# Patient Record
Sex: Male | Born: 1985 | Race: White | Hispanic: No | Marital: Single | Smoking: Current every day smoker
Health system: Southern US, Community
[De-identification: ages and names within clinical notes are randomized; demographics above are authoritative.]

## PROBLEM LIST (undated history)

## (undated) ENCOUNTER — Ambulatory Visit: Payer: BC Managed Care – PPO

## (undated) DIAGNOSIS — N200 Calculus of kidney: Secondary | ICD-10-CM

## (undated) HISTORY — PX: NO PAST SURGERIES: SHX2092

---
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
## 2006-12-04 ENCOUNTER — Ambulatory Visit: Payer: Self-pay | Admitting: Family Medicine

## 2008-09-28 ENCOUNTER — Emergency Department: Payer: Self-pay | Admitting: Emergency Medicine

## 2011-01-31 ENCOUNTER — Emergency Department: Payer: Self-pay | Admitting: Emergency Medicine

## 2011-05-21 ENCOUNTER — Emergency Department: Payer: Self-pay | Admitting: Emergency Medicine

## 2015-08-15 ENCOUNTER — Encounter: Payer: Self-pay | Admitting: Emergency Medicine

## 2015-08-15 ENCOUNTER — Emergency Department
Admission: EM | Admit: 2015-08-15 | Discharge: 2015-08-15 | Disposition: A | Discharge status: Home / Self Care | Payer: BLUE CROSS/BLUE SHIELD | Attending: Emergency Medicine | Admitting: Emergency Medicine

## 2015-08-15 ENCOUNTER — Emergency Department: Payer: BLUE CROSS/BLUE SHIELD

## 2015-08-15 DIAGNOSIS — R109 Unspecified abdominal pain: Secondary | ICD-10-CM | POA: Diagnosis present

## 2015-08-15 DIAGNOSIS — F17200 Nicotine dependence, unspecified, uncomplicated: Secondary | ICD-10-CM | POA: Insufficient documentation

## 2015-08-15 DIAGNOSIS — N23 Unspecified renal colic: Secondary | ICD-10-CM | POA: Insufficient documentation

## 2015-08-15 LAB — CBC
HCT: 39.6 % — ABNORMAL LOW (ref 40.0–52.0)
HEMOGLOBIN: 13.4 g/dL (ref 13.0–18.0)
MCH: 31.2 pg (ref 26.0–34.0)
MCHC: 33.9 g/dL (ref 32.0–36.0)
MCV: 92.1 fL (ref 80.0–100.0)
Platelets: 315 10*3/uL (ref 150–440)
RBC: 4.3 MIL/uL — AB (ref 4.40–5.90)
RDW: 12.7 % (ref 11.5–14.5)
WBC: 13.3 10*3/uL — AB (ref 3.8–10.6)

## 2015-08-15 LAB — URINALYSIS COMPLETE WITH MICROSCOPIC (ARMC ONLY)
BILIRUBIN URINE: NEGATIVE
GLUCOSE, UA: NEGATIVE mg/dL
Ketones, ur: NEGATIVE mg/dL
Leukocytes, UA: NEGATIVE
Nitrite: NEGATIVE
PH: 5 (ref 5.0–8.0)
Protein, ur: 30 mg/dL — AB
Specific Gravity, Urine: 1.028 (ref 1.005–1.030)

## 2015-08-15 LAB — BASIC METABOLIC PANEL
ANION GAP: 5 (ref 5–15)
BUN: 12 mg/dL (ref 6–20)
CHLORIDE: 108 mmol/L (ref 101–111)
CO2: 27 mmol/L (ref 22–32)
Calcium: 8.8 mg/dL — ABNORMAL LOW (ref 8.9–10.3)
Creatinine, Ser: 0.87 mg/dL (ref 0.61–1.24)
GFR calc Af Amer: >60 mL/min (ref >60)
Glucose, Bld: 127 mg/dL — ABNORMAL HIGH (ref 65–99)
POTASSIUM: 3.7 mmol/L (ref 3.5–5.1)
SODIUM: 140 mmol/L (ref 135–145)

## 2015-08-15 MED ORDER — KETOROLAC TROMETHAMINE 30 MG/ML IJ SOLN
30.0000 mg | Freq: Once | INTRAMUSCULAR | Status: AC
  Administered 2015-08-15: 30 mg via INTRAVENOUS
  Filled 2015-08-15: qty 1

## 2015-08-15 MED ORDER — TAMSULOSIN HCL 0.4 MG PO CAPS
0.4000 mg | ORAL_CAPSULE | Freq: Every day | ORAL | Status: DC
Start: 2015-08-15 — End: 2017-10-09

## 2015-08-15 MED ORDER — ONDANSETRON 4 MG PO TBDP: 4.0000 mg | ORAL_TABLET | Freq: Three times a day (TID) | ORAL | Status: DC | PRN

## 2015-08-15 MED ORDER — KETOROLAC TROMETHAMINE 10 MG PO TABS: 10.0000 mg | ORAL_TABLET | Freq: Three times a day (TID) | ORAL | Status: DC | PRN

## 2015-08-15 MED ORDER — HYDROMORPHONE HCL 1 MG/ML IJ SOLN
1.0000 mg | Freq: Once | INTRAMUSCULAR | Status: AC
  Administered 2015-08-15: 1 mg via INTRAVENOUS
  Filled 2015-08-15: qty 1

## 2015-08-15 MED ORDER — ONDANSETRON HCL 4 MG/2ML IJ SOLN
4.0000 mg | Freq: Once | INTRAMUSCULAR | Status: AC
  Administered 2015-08-15: 4 mg via INTRAVENOUS
  Filled 2015-08-15: qty 2

## 2015-08-15 MED ORDER — OXYCODONE-ACETAMINOPHEN 5-325 MG PO TABS: 1.0000 | ORAL_TABLET | Freq: Four times a day (QID) | ORAL | Status: AC | PRN

## 2015-08-15 MED ORDER — SODIUM CHLORIDE 0.9 % IV BOLUS (SEPSIS)
1000.0000 mL | Freq: Once | INTRAVENOUS | Status: AC
  Administered 2015-08-15: 1000 mL via INTRAVENOUS

## 2015-08-15 NOTE — ED Provider Notes (Signed)
[facility] Emergency Department Provider Note  ____________________________________________  Time seen: Approximately 9:04 PM  I have reviewed the triage vital signs and the nursing notes.   HISTORY  Chief Complaint Flank Pain    HPI Michael BjorkDavid A Dorris Jr. is a 30 y.o. male with a family history of renal colic presenting with acute onset of right flank pain.Patient states he woke up with pain in the right flank which has progressively gotten worse and more severe. The pain radiates into the penis. Patient also reports a sensation of being unable to completely void. No hematuria. No fever. Positive nausea without vomiting. He is unable to get comfortable and there is nothing that makes the pain better.   History reviewed. No pertinent past medical history.  There are no active problems to display for this patient.   History reviewed. No pertinent past surgical history.  Current Outpatient Rx  Name  Route  Sig  Dispense  Refill  . ketorolac (TORADOL) 10 MG tablet   Oral   Take 1 tablet (10 mg total) by mouth every 8 (eight) hours as needed for moderate pain (with food).   15 tablet   0   . ondansetron (ZOFRAN ODT) 4 MG disintegrating tablet   Oral   Take 1 tablet (4 mg total) by mouth every 8 (eight) hours as needed for nausea or vomiting.   20 tablet   0   . oxyCODONE-acetaminophen (ROXICET) 5-325 MG tablet   Oral   Take 1 tablet by mouth every 6 (six) hours as needed.   20 tablet   0   . tamsulosin (FLOMAX) 0.4 MG CAPS capsule   Oral   Take 1 capsule (0.4 mg total) by mouth daily.   15 capsule   0     Allergies Review of patient's allergies indicates no known allergies.  History reviewed. No pertinent family history.  Social History Social History  Substance Use Topics  . Smoking status: Current Every Day Smoker  . Smokeless tobacco: None  . Alcohol Use: No    Review of Systems Constitutional: No fever/chills. No  lightheadedness or syncope. Eyes: No visual changes. ENT: No sore throat. Cardiovascular: Denies chest pain, palpitations. Respiratory: Denies shortness of breath.  No cough. Gastrointestinal: No abdominal pain. Positive flank pain on the right. Positive nausea, no vomiting.  No diarrhea.  No constipation. Genitourinary: Negative for dysuria. Positive for sensation of incomplete voiding. No hematuria. Musculoskeletal: Negative for midline back pain. Positive for right flank pain. Skin: Negative for rash. Neurological: Negative for headaches, focal weakness or numbness.  10-point ROS otherwise negative.  ____________________________________________   PHYSICAL EXAM:  VITAL SIGNS: ED Triage Vitals  Enc Vitals Group     BP 08/15/15 1859 125/76 mmHg     Pulse Rate 08/15/15 1859 89     Resp 08/15/15 1859 16     Temp 08/15/15 1859 98 F (36.7 C)     Temp src --      SpO2 08/15/15 1859 98 %     Weight 08/15/15 1859 210 lb (95.255 kg)     Height 08/15/15 1859 6' (1.829 m)     Head Cir --      Peak Flow --      Pain Score 08/15/15 1858 9     Pain Loc --      Pain Edu? --      Excl. in GC? --     Constitutional: Alert and oriented. Able to answer questions appropriately area  patient is writhing around in pain on the bed and unable to get in a comfortable position.  Eyes: Conjunctivae are normal.  EOMI. scleral icterus. Head: Atraumatic. Nose: No congestion/rhinnorhea. Mouth/Throat: Mucous membranes are moist.  Neck: No stridor.  Supple.   Cardiovascular: Normal rate, regular rhythm. No murmurs, rubs or gallops.  Respiratory: Normal respiratory effort.  No retractions. Lungs CTAB.  No wheezes, rales or ronchi. Gastrointestinal: Positive right CVA tenderness. Abdomen is soft, nondistended, nontender. No guarding or rebound, no peritoneal signs. Musculoskeletal: No LE edema.  Neurologic:  Normal speech and language. No gross focal neurologic deficits are appreciated.  Skin:  Skin  is warm, dry and intact. No rash noted. Psychiatric: Mood and affect are normal. Speech and behavior are normal.  Normal judgement  ____________________________________________   LABS (all labs ordered are listed, but only abnormal results are displayed)  Labs Reviewed  URINALYSIS COMPLETEWITH MICROSCOPIC ([facility] ONLY) - Abnormal; Notable for the following:    Color, Urine YELLOW (*)    APPearance HAZY (*)    Hgb urine dipstick 3+ (*)    Protein, ur 30 (*)    Bacteria, UA RARE (*)    Squamous Epithelial / LPF 0-5 (*)    All other components within normal limits  CBC - Abnormal; Notable for the following:    WBC 13.3 (*)    RBC 4.30 (*)    HCT 39.6 (*)    All other components within normal limits  BASIC METABOLIC PANEL - Abnormal; Notable for the following:    Glucose, Bld 127 (*)    Calcium 8.8 (*)    All other components within normal limits   ____________________________________________  EKG  Not indicated ____________________________________________  RADIOLOGY  Ct Renal Stone Study  08/15/2015  CLINICAL DATA:  Acute onset of right flank pain.  Initial encounter. EXAM: CT ABDOMEN AND PELVIS WITHOUT CONTRAST TECHNIQUE: Multidetector CT imaging of the abdomen and pelvis was performed following the standard protocol without IV contrast. COMPARISON:  None. FINDINGS: The visualized lung bases are clear. The liver and spleen are unremarkable in appearance. The gallbladder is within normal limits. The pancreas and adrenal glands are unremarkable. A 3 mm obstructing stone is noted at the distal right ureter, just above the right vesicoureteral junction, with minimal right-sided hydronephrosis. No nonobstructing renal stones are identified. No significant perinephric stranding is seen. The left kidney is unremarkable in appearance. No free fluid is identified. The small bowel is unremarkable in appearance. The stomach is within normal limits. No acute vascular abnormalities are seen. The  appendix is normal in caliber, without evidence of appendicitis. The colon is unremarkable in appearance. The bladder is decompressed and not well assessed. The prostate remains normal in size. No inguinal lymphadenopathy is seen. No acute osseous abnormalities are identified. IMPRESSION: Minimal right-sided hydronephrosis, with a 3 mm obstructing stone at the distal right ureter, just above the right vesicoureteral junction. Electronically Signed   By: Roanna Raider M.D.   On: 08/15/2015 21:24    ____________________________________________   PROCEDURES  Procedure(s) performed: no  Critical Care performed: No ____________________________________________   INITIAL IMPRESSION / ASSESSMENT AND PLAN / ED COURSE  Pertinent labs & imaging results that were available during my care of the patient were reviewed by me and considered in my medical decision making (see chart for details).  30 y.o. male, otherwise healthy, presenting with acute onset of right flank pain radiating to the penis. The patient does have significant red blood cells in his urine and  clinically has a history and physical examination that is consistent with renal colic. We'll get a CT scan for further evaluation. Consider pyelonephritis.  ----------------------------------------- 9:40 PM on 08/15/2015 -----------------------------------------  The patient's pain has almost completely resolved and he is able to tolerate by mouth. I will plan to discharge him home and have talked to Dr. Ferd Hibbs who is on-call for [facility] urology about follow-up next week. The patient understands return precautions as well as follow-up instructions.  ____________________________________________  FINAL CLINICAL IMPRESSION(S) / ED DIAGNOSES  Final diagnoses:  Flank pain  Renal colic on right side      NEW MEDICATIONS STARTED DURING THIS VISIT:  New Prescriptions   KETOROLAC (TORADOL) 10 MG TABLET    Take 1 tablet (10 mg total)  by mouth every 8 (eight) hours as needed for moderate pain (with food).   ONDANSETRON (ZOFRAN ODT) 4 MG DISINTEGRATING TABLET    Take 1 tablet (4 mg total) by mouth every 8 (eight) hours as needed for nausea or vomiting.   OXYCODONE-ACETAMINOPHEN (ROXICET) 5-325 MG TABLET    Take 1 tablet by mouth every 6 (six) hours as needed.   TAMSULOSIN (FLOMAX) 0.4 MG CAPS CAPSULE    Take 1 capsule (0.4 mg total) by mouth daily.     Rockne Menghini, MD 08/15/15 2141

## 2015-08-15 NOTE — Discharge Instructions (Signed)
Please drink plenty of fluids stay well-hydrated and to help flush your kidney stone.  Please take Toradol for mild to moderate pain and Percocet for severe pain. Do not take other NSAID medications such as ibuprofen, Advil, Motrin or Aleve when you're taking Toradol. Zofran is for nausea and vomiting.  Please strain your urine and if you catch your kidney stone, bring it with you to your urology follow-up appointment.  Please return to the emergency department if you develop severe pain, inability to keep down fluids, fever, or any other symptoms concerning to you.

## 2015-08-15 NOTE — ED Notes (Signed)
Pt has finished entire ginger ale without nausea/vomiting per MD request for PO challenge before discharge

## 2015-08-15 NOTE — ED Notes (Signed)
Pt given urine strainer per MD nursing communication order

## 2015-08-15 NOTE — ED Notes (Signed)
Right flank pain that began today. No dysuria. Denies abdominal pain.

## 2015-08-15 NOTE — ED Notes (Signed)
MD Norman at bedside 

## 2015-08-19 DIAGNOSIS — Z79899 Other long term (current) drug therapy: Secondary | ICD-10-CM | POA: Insufficient documentation

## 2015-08-19 DIAGNOSIS — R109 Unspecified abdominal pain: Secondary | ICD-10-CM | POA: Diagnosis present

## 2015-08-19 DIAGNOSIS — F17200 Nicotine dependence, unspecified, uncomplicated: Secondary | ICD-10-CM | POA: Diagnosis not present

## 2015-08-19 DIAGNOSIS — N200 Calculus of kidney: Secondary | ICD-10-CM | POA: Insufficient documentation

## 2015-08-20 ENCOUNTER — Encounter: Payer: Self-pay | Admitting: Urgent Care

## 2015-08-20 ENCOUNTER — Emergency Department: Payer: BLUE CROSS/BLUE SHIELD

## 2015-08-20 ENCOUNTER — Emergency Department
Admission: EM | Admit: 2015-08-20 | Discharge: 2015-08-20 | Disposition: A | Discharge status: Home / Self Care | Payer: BLUE CROSS/BLUE SHIELD | Attending: Emergency Medicine | Admitting: Emergency Medicine

## 2015-08-20 DIAGNOSIS — N200 Calculus of kidney: Secondary | ICD-10-CM

## 2015-08-20 DIAGNOSIS — R109 Unspecified abdominal pain: Secondary | ICD-10-CM

## 2015-08-20 HISTORY — DX: Calculus of kidney: N20.0

## 2015-08-20 LAB — URINALYSIS COMPLETE WITH MICROSCOPIC (ARMC ONLY)
Bilirubin Urine: NEGATIVE
Glucose, UA: NEGATIVE mg/dL
Ketones, ur: NEGATIVE mg/dL
Leukocytes, UA: NEGATIVE
NITRITE: NEGATIVE
PH: 6 (ref 5.0–8.0)
Protein, ur: NEGATIVE mg/dL
SQUAMOUS EPITHELIAL / LPF: NONE SEEN
Specific Gravity, Urine: 1.021 (ref 1.005–1.030)
WBC, UA: NONE SEEN WBC/hpf (ref 0–5)

## 2015-08-20 MED ORDER — KETOROLAC TROMETHAMINE 30 MG/ML IJ SOLN
30.0000 mg | Freq: Once | INTRAMUSCULAR | Status: AC
  Administered 2015-08-20: 30 mg via INTRAVENOUS
  Filled 2015-08-20: qty 1

## 2015-08-20 MED ORDER — SODIUM CHLORIDE 0.9 % IV BOLUS (SEPSIS)
1000.0000 mL | Freq: Once | INTRAVENOUS | Status: AC
  Administered 2015-08-20: 1000 mL via INTRAVENOUS

## 2015-08-20 NOTE — ED Notes (Signed)
Discussed discharge instructions and follow-up care with patient. No questions or concerns at this time. Pt stable at discharge.  

## 2015-08-20 NOTE — ED Provider Notes (Signed)
[facility] Emergency Department Provider Note  ____________________________________________  Time seen: 4:00 AM  I have reviewed the triage vital signs and the nursing notes.   HISTORY  Chief Complaint Flank Pain    HPI Ardine BjorkDavid A Reliford Jr. is a 30 y.o. male presents with [place] Pertvan out of 10 right flank pain with onset at 6:30 PM today. Of note patient was recently seen in the emergency department on 08/15/2015 and diagnosed with 3 mm distal right ureter stone. Patient states subsequent to that encounter he went home and "passed a stone". However pain recurred tonight the right flank. Patient denies any nausea or vomiting     Past Medical History  Diagnosis Date  . Kidney stone     There are no active problems to display for this patient.   History reviewed. No pertinent past surgical history.  Current Outpatient Rx  Name  Route  Sig  Dispense  Refill  . ketorolac (TORADOL) 10 MG tablet   Oral   Take 1 tablet (10 mg total) by mouth every 8 (eight) hours as needed for moderate pain (with food).   15 tablet   0   . ondansetron (ZOFRAN ODT) 4 MG disintegrating tablet   Oral   Take 1 tablet (4 mg total) by mouth every 8 (eight) hours as needed for nausea or vomiting.   20 tablet   0   . oxyCODONE-acetaminophen (ROXICET) 5-325 MG tablet   Oral   Take 1 tablet by mouth every 6 (six) hours as needed.   20 tablet   0   . tamsulosin (FLOMAX) 0.4 MG CAPS capsule   Oral   Take 1 capsule (0.4 mg total) by mouth daily.   15 capsule   0     Allergies No known drug allergies No family history on file.  Social History Social History  Substance Use Topics  . Smoking status: Current Every Day Smoker  . Smokeless tobacco: None  . Alcohol Use: No    Review of Systems  Constitutional: Negative for fever. Eyes: Negative for visual changes. ENT: Negative for sore throat. Cardiovascular: Negative for chest pain. Respiratory: Negative for  shortness of breath. Gastrointestinal: Negative for abdominal pain, vomiting and diarrhea. Genitourinary: Negative for dysuria. Musculoskeletal: Negative for back pain. Skin: Negative for rash. Neurological: Negative for headaches, focal weakness or numbness.   10-point ROS otherwise negative.  ____________________________________________   PHYSICAL EXAM:  VITAL SIGNS: ED Triage Vitals  Enc Vitals Group     BP 08/20/15 0011 116/69 mmHg     Pulse Rate 08/20/15 0011 98     Resp 08/20/15 0011 16     Temp 08/20/15 0011 98 F (36.7 C)     Temp Source 08/20/15 0011 Oral     SpO2 08/20/15 0011 100 %     Weight --      Height --      Head Cir --      Peak Flow --      Pain Score 08/20/15 0012 7     Pain Loc --      Pain Edu? --      Excl. in GC? --     Constitutional: Alert and oriented. Well appearing and in no distress. Eyes: Conjunctivae are normal. PERRL. Normal extraocular movements. ENT   Head: Normocephalic and atraumatic.   Nose: No congestion/rhinnorhea.   Mouth/Throat: Mucous membranes are moist.   Neck: No stridor. Hematological/Lymphatic/Immunilogical: No cervical lymphadenopathy. Cardiovascular: Normal rate, regular rhythm. Normal and  symmetric distal pulses are present in all extremities. No murmurs, rubs, or gallops. Respiratory: Normal respiratory effort without tachypnea nor retractions. Breath sounds are clear and equal bilaterally. No wheezes/rales/rhonchi. Gastrointestinal: Soft and nontender. No distention. There is no CVA tenderness. Genitourinary: deferred Musculoskeletal: Nontender with normal range of motion in all extremities. No joint effusions.  No lower extremity tenderness nor edema. Neurologic:  Normal speech and language. No gross focal neurologic deficits are appreciated. Speech is normal.  Skin:  Skin is warm, dry and intact. No rash noted. Psychiatric: Mood and affect are normal. Speech and behavior are normal. Patient exhibits  appropriate insight and judgment.  ____________________________________________    LABS (pertinent positives/negatives)  Labs Reviewed  URINALYSIS COMPLETEWITH MICROSCOPIC ([facility] ONLY) - Abnormal; Notable for the following:    Color, Urine YELLOW (*)    APPearance HAZY (*)    Hgb urine dipstick 3+ (*)    Bacteria, UA RARE (*)    All other components within normal limits         RADIOLOGY  DG Abd 1 View (Final result) Result time: 08/20/15 01:31:50   Final result by Rad Results In Interface (08/20/15 01:31:50)   Narrative:   CLINICAL DATA: 30 year old male with right flank pain  EXAM: ABDOMEN - 1 VIEW  COMPARISON: CT dated 08/15/2015  FINDINGS: Constipation. No bowel obstruction. No radiopaque calculus or foreign object. The osseous structures appear unremarkable.  IMPRESSION: Constipation.  No radiopaque calculus.   Electronically Signed By: Elgie Collard M.D. On: 08/20/2015 01:31           ___________________________________  INITIAL IMPRESSION / ASSESSMENT AND PLAN / ED COURSE  Pertinent labs & imaging results that were available during my care of the patient were reviewed by me and considered in my medical decision making (see chart for details).  Patient received Toradol 30 mg IV as well as liter of normal saline.  ____________________________________________   FINAL CLINICAL IMPRESSION(S) / ED DIAGNOSES  Final diagnoses:  Acute right flank pain  Kidney stone on right side      Darci Current, MD 08/20/15 [phone]

## 2015-08-20 NOTE — Discharge Instructions (Signed)
Kidney Stones °Kidney stones (urolithiasis) are deposits that form inside your kidneys. The intense pain is caused by the stone moving through the urinary tract. When the stone moves, the ureter goes into spasm around the stone. The stone is usually passed in the urine.  °CAUSES  °· A disorder that makes certain neck glands produce too much parathyroid hormone (primary hyperparathyroidism). °· A buildup of uric acid crystals, similar to gout in your joints. °· Narrowing (stricture) of the ureter. °· A kidney obstruction present at birth (congenital obstruction). °· Previous surgery on the kidney or ureters. °· Numerous kidney infections. °SYMPTOMS  °· Feeling sick to your stomach (nauseous). °· Throwing up (vomiting). °· Blood in the urine (hematuria). °· Pain that usually spreads (radiates) to the groin. °· Frequency or urgency of urination. °DIAGNOSIS  °· Taking a history and physical exam. °· Blood or urine tests. °· CT scan. °· Occasionally, an examination of the inside of the urinary bladder (cystoscopy) is performed. °TREATMENT  °· Observation. °· Increasing your fluid intake. °· Extracorporeal shock wave lithotripsy--This is a noninvasive procedure that uses shock waves to break up kidney stones. °· Surgery may be needed if you have severe pain or persistent obstruction. There are various surgical procedures. Most of the procedures are performed with the use of small instruments. Only small incisions are needed to accommodate these instruments, so recovery time is minimized. °The size, location, and chemical composition are all important variables that will determine the proper choice of action for you. Talk to your health care provider to better understand your situation so that you will minimize the risk of injury to yourself and your kidney.  °HOME CARE INSTRUCTIONS  °· Drink enough water and fluids to keep your urine clear or pale yellow. This will help you to pass the stone or stone fragments. °· Strain  all urine through the provided strainer. Keep all particulate matter and stones for your health care provider to see. The stone causing the pain may be as small as a grain of salt. It is very important to use the strainer each and every time you pass your urine. The collection of your stone will allow your health care provider to analyze it and verify that a stone has actually passed. The stone analysis will often identify what you can do to reduce the incidence of recurrences. °· Only take over-the-counter or prescription medicines for pain, discomfort, or fever as directed by your health care provider. °· Keep all follow-up visits as told by your health care provider. This is important. °· Get follow-up X-rays if required. The absence of pain does not always mean that the stone has passed. It may have only stopped moving. If the urine remains completely obstructed, it can cause loss of kidney function or even complete destruction of the kidney. It is your responsibility to make sure X-rays and follow-ups are completed. Ultrasounds of the kidney can show blockages and the status of the kidney. Ultrasounds are not associated with any radiation and can be performed easily in a matter of minutes. °· Make changes to your daily diet as told by your health care provider. You may be told to: °¨ Limit the amount of salt that you eat. °¨ Eat 5 or more servings of fruits and vegetables each day. °¨ Limit the amount of meat, poultry, fish, and eggs that you eat. °· Collect a 24-hour urine sample as told by your health care provider. You may need to collect another urine sample every 6-12   months. SEEK MEDICAL CARE IF:  You experience pain that is progressive and unresponsive to any pain medicine you have been prescribed. SEEK IMMEDIATE MEDICAL CARE IF:   Pain cannot be controlled with the prescribed medicine.  You have a fever or shaking chills.  The severity or intensity of pain increases over 18 hours and is not  relieved by pain medicine.  You develop a new onset of abdominal pain.  You feel faint or pass out.  You are unable to urinate.   This information is not intended to replace advice given to you by your health care provider. Make sure you discuss any questions you have with your health care provider.   Document Released: 07/25/2005 Document Revised: 04/15/2015 Document Reviewed: 12/26/2012 Elsevier Interactive Patient Education 2016 [employer]  Flank Pain Flank pain is pain in your side. The flank is the area of your side between your upper belly (abdomen) and your back. Pain in this area can be caused by many different things. HOME CARE Home care and treatment will depend on the cause of your pain.  Rest as told by your doctor.  Drink enough fluids to keep your pee (urine) clear or pale yellow.  Only take medicine as told by your doctor.  Tell your doctor about any changes in your pain.  Follow up with your doctor. GET HELP RIGHT AWAY IF:   Your pain does not get better with medicine.   You have new symptoms or your symptoms get worse.  Your pain gets worse.   You have belly (abdominal) pain.   You are short of breath.   You always feel sick to your stomach (nauseous).   You keep throwing up (vomiting).   You have puffiness (swelling) in your belly.   You feel light-headed or you pass out (faint).   You have blood in your pee.  You have a fever or lasting symptoms for more than 2-3 days.  You have a fever and your symptoms suddenly get worse. MAKE SURE YOU:   Understand these instructions.  Will watch your condition.  Will get help right away if you are not doing well or get worse.   This information is not intended to replace advice given to you by your health care provider. Make sure you discuss any questions you have with your health care provider.   Document Released: 05/03/2008 Document Revised: 08/15/2014 Document Reviewed:  03/08/2012 Elsevier Interactive Patient Education [employer] Inc[employer]

## 2015-08-20 NOTE — ED Notes (Signed)
Patient presents with c/o RIGHT flank pain since around 1830. Patient reports that he was here on Saturday for the same - passed stone on Sunday. Patient reports that pain returned tonight.

## 2017-01-05 IMAGING — CT CT RENAL STONE PROTOCOL
1 of 2 series · 15 of 32 positions shown, 19 images · non-contrast
Comparison: None.

CLINICAL DATA: Acute onset of right flank pain.  Initial encounter.

EXAM:
CT ABDOMEN AND PELVIS WITHOUT CONTRAST
TECHNIQUE: Multidetector CT imaging of the abdomen and pelvis was performed
following the standard protocol without IV contrast.

[Series 2: stone standard full · axial · 0.76mm/px · z∈[-533,-78]mm · 15 of 101 slices shown, 19 images]
[im 5/101  soft-tissue]
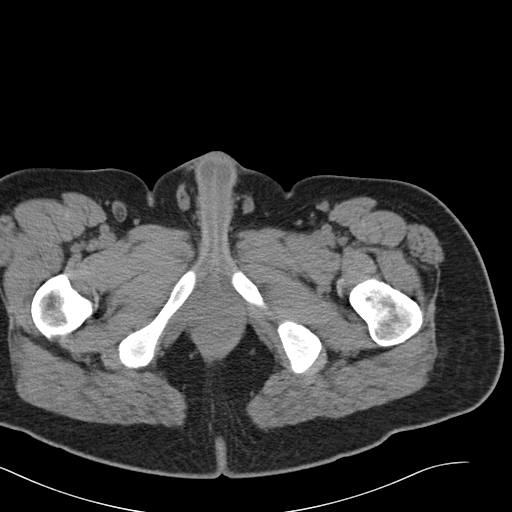
[im 5/101  bone]
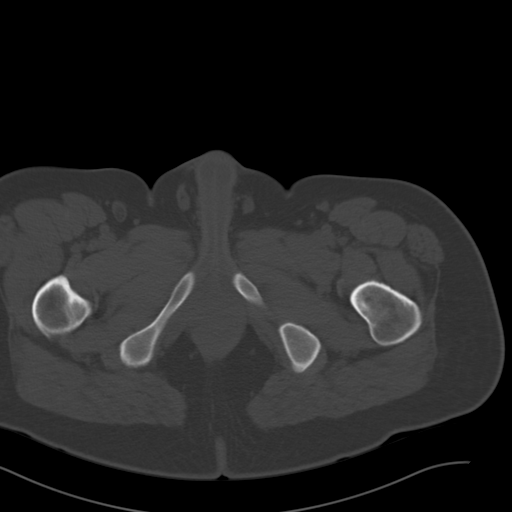
[im 13/101  soft-tissue]
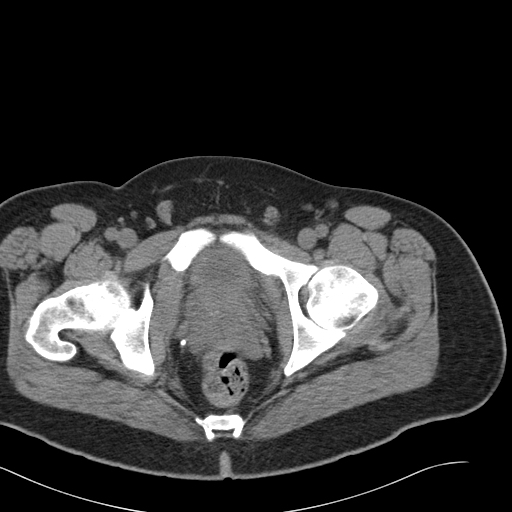
[im 21/101  soft-tissue]
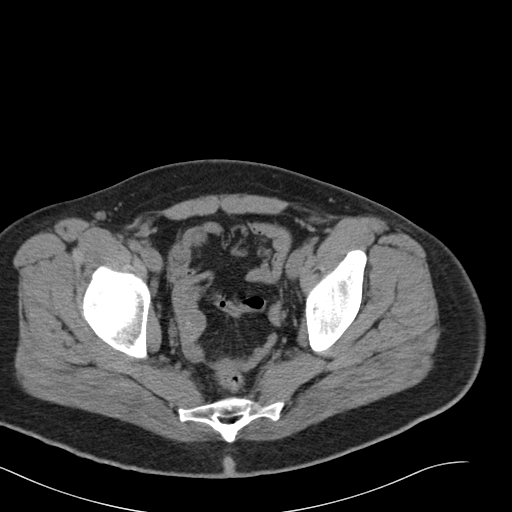
[im 30/101  soft-tissue]
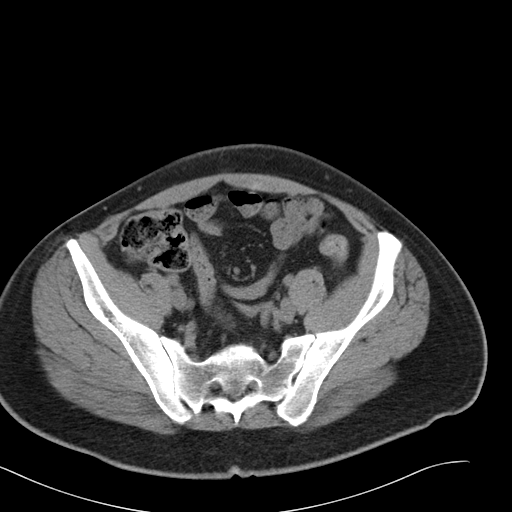
[im 34/101  soft-tissue]
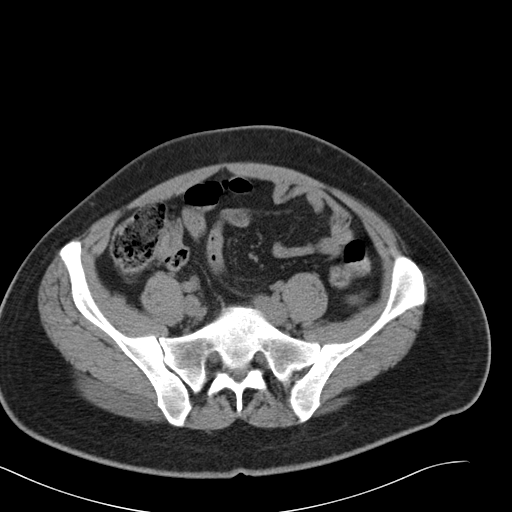
[im 42/101  soft-tissue]
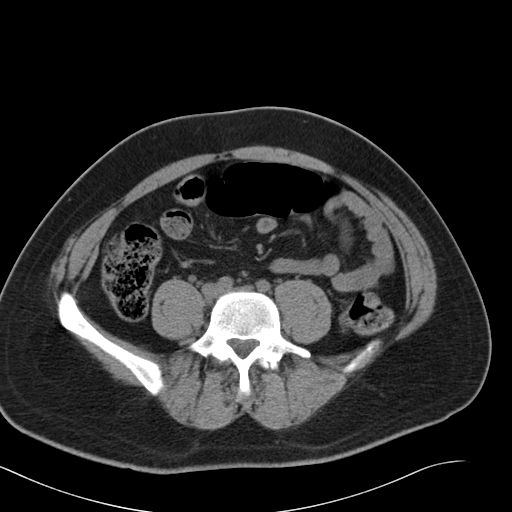
[im 51/101  soft-tissue]
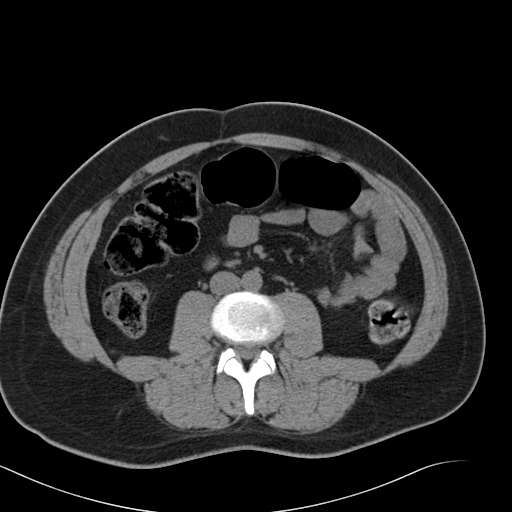
[im 59/101  soft-tissue]
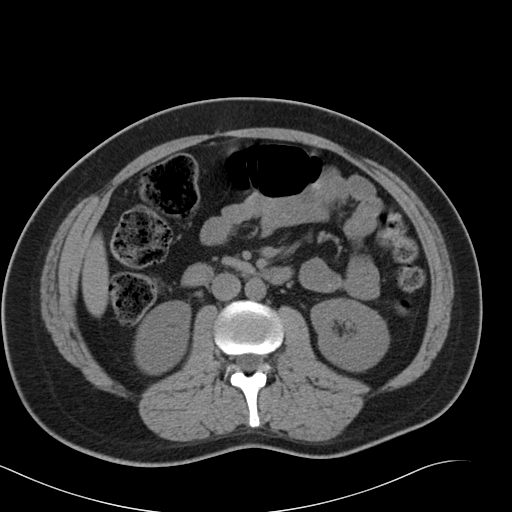
[im 67/101  soft-tissue]
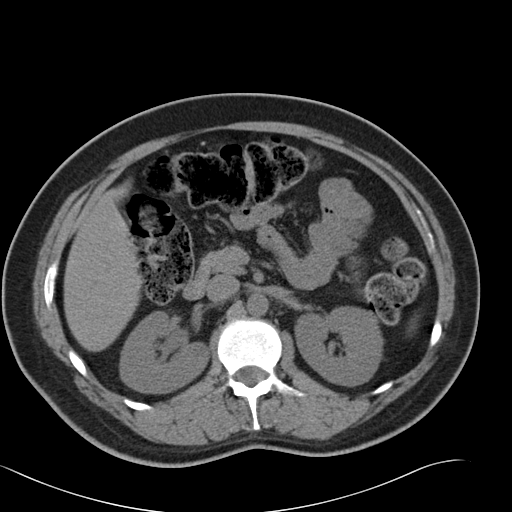
[im 67/101  bone]
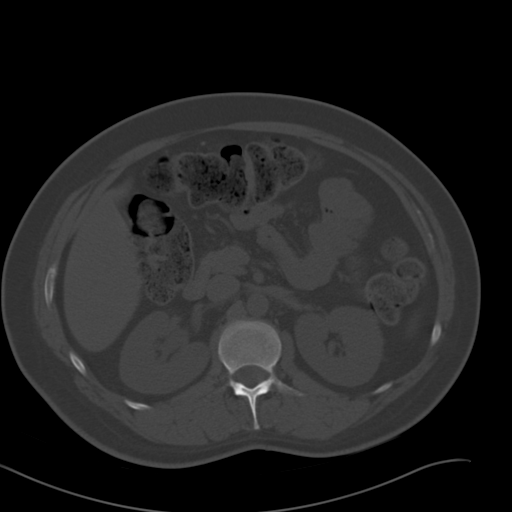
[im 71/101  soft-tissue]
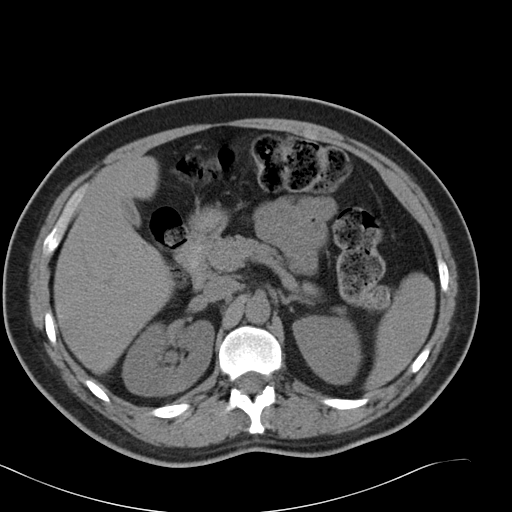
[im 80/101  soft-tissue]
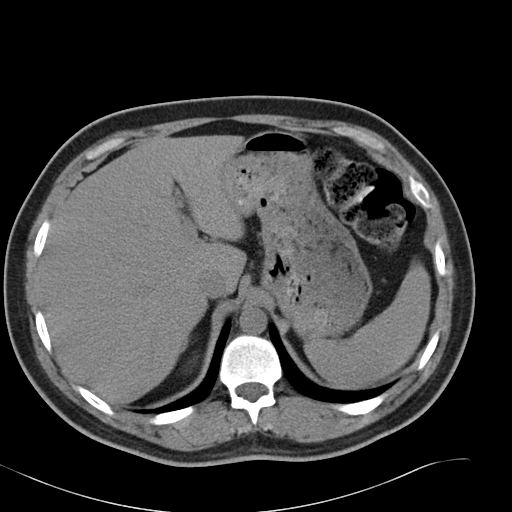
[im 84/101  lung]
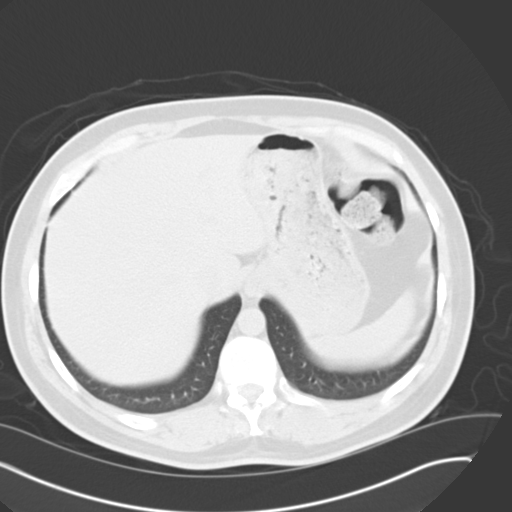
[im 88/101  soft-tissue]
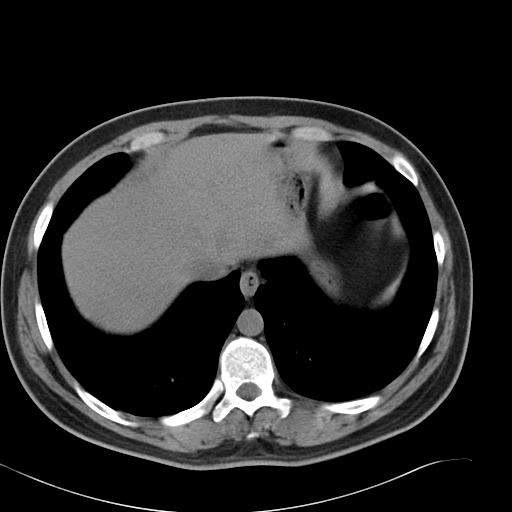
[im 88/101  lung]
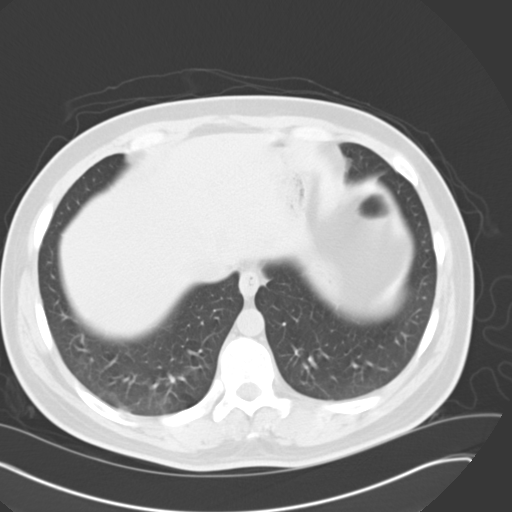
[im 92/101  lung]
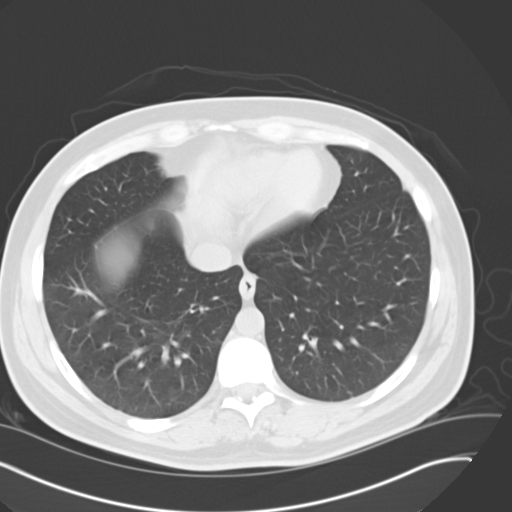
[im 96/101  soft-tissue]
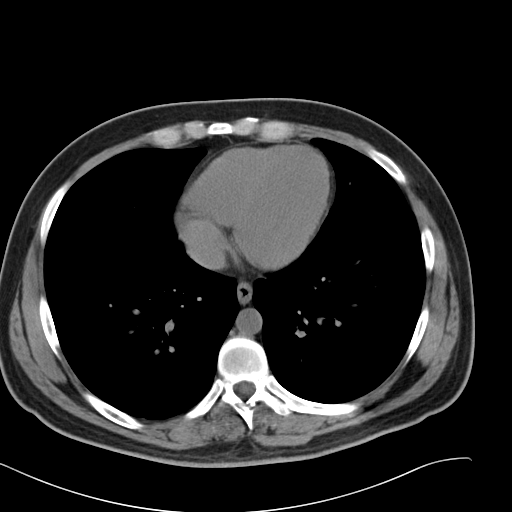
[im 96/101  lung]
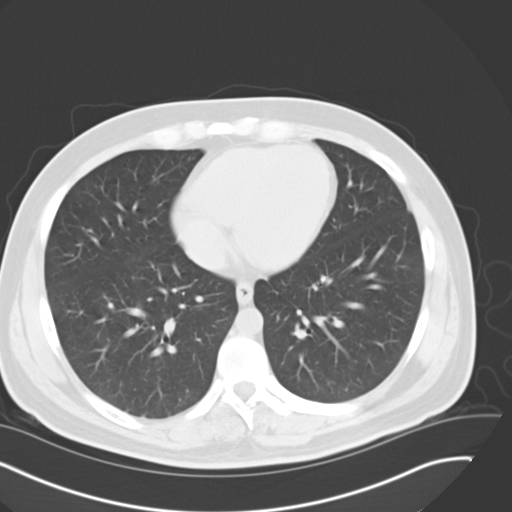

[15 of 32 positions shown; findings below may reference images not displayed]

FINDINGS: The visualized lung bases are clear.

The liver and spleen are unremarkable in appearance. The gallbladder
is within normal limits. The pancreas and adrenal glands are
unremarkable.

A 3 mm obstructing stone is noted at the distal right ureter, just
above the right vesicoureteral junction, with minimal right-sided
hydronephrosis. No nonobstructing renal stones are identified. No
significant perinephric stranding is seen. The left kidney is
unremarkable in appearance.

No free fluid is identified. The small bowel is unremarkable in
appearance. The stomach is within normal limits. No acute vascular
abnormalities are seen.

The appendix is normal in caliber, without evidence of appendicitis.
The colon is unremarkable in appearance.

The bladder is decompressed and not well assessed. The prostate
remains normal in size. No inguinal lymphadenopathy is seen.

No acute osseous abnormalities are identified.
IMPRESSION: Minimal right-sided hydronephrosis, with a 3 mm obstructing stone at
the distal right ureter, just above the right vesicoureteral
junction.

## 2017-01-10 IMAGING — CR DG ABDOMEN 1V
1 series · 2 of 2 positions shown · non-contrast
Comparison: CT dated 08/15/2015

CLINICAL DATA: 29-year-old male with right flank pain

EXAM:
ABDOMEN - 1 VIEW

[Series 1: t abdomen supine · 0.14mm/px · 2 of 2 slices shown]
[im 1/2]
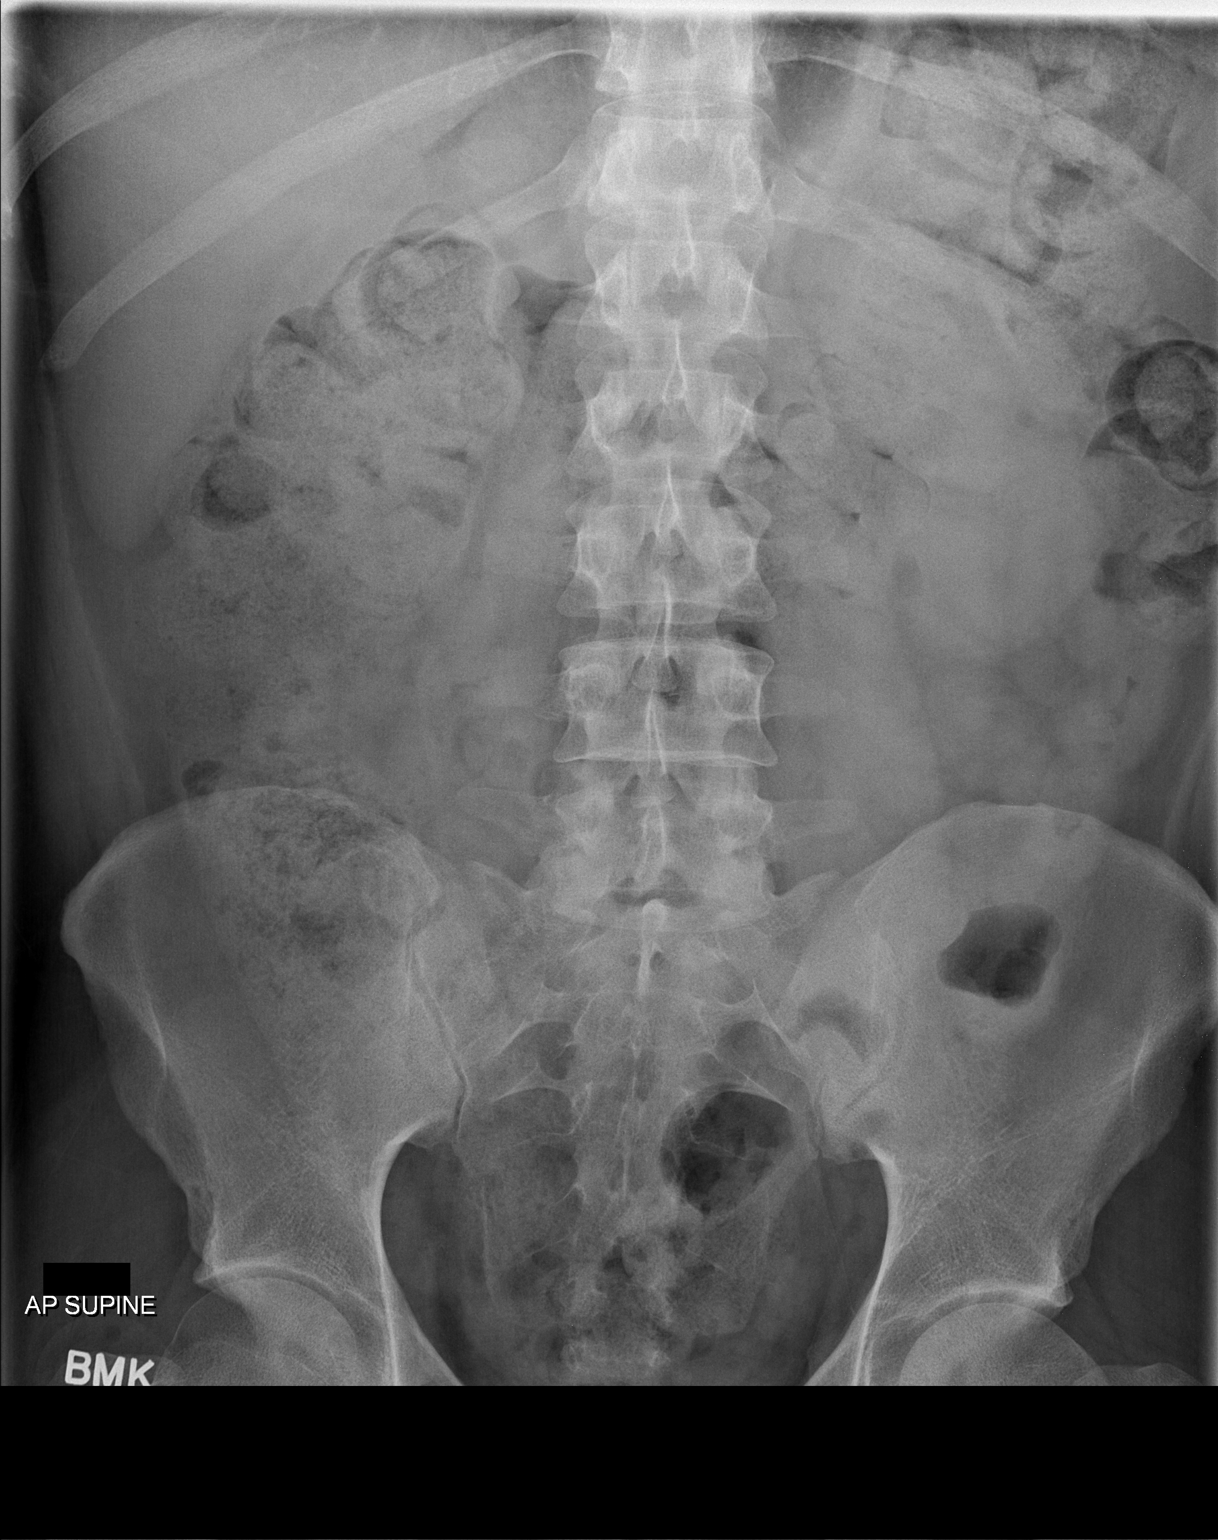
[im 2/2]
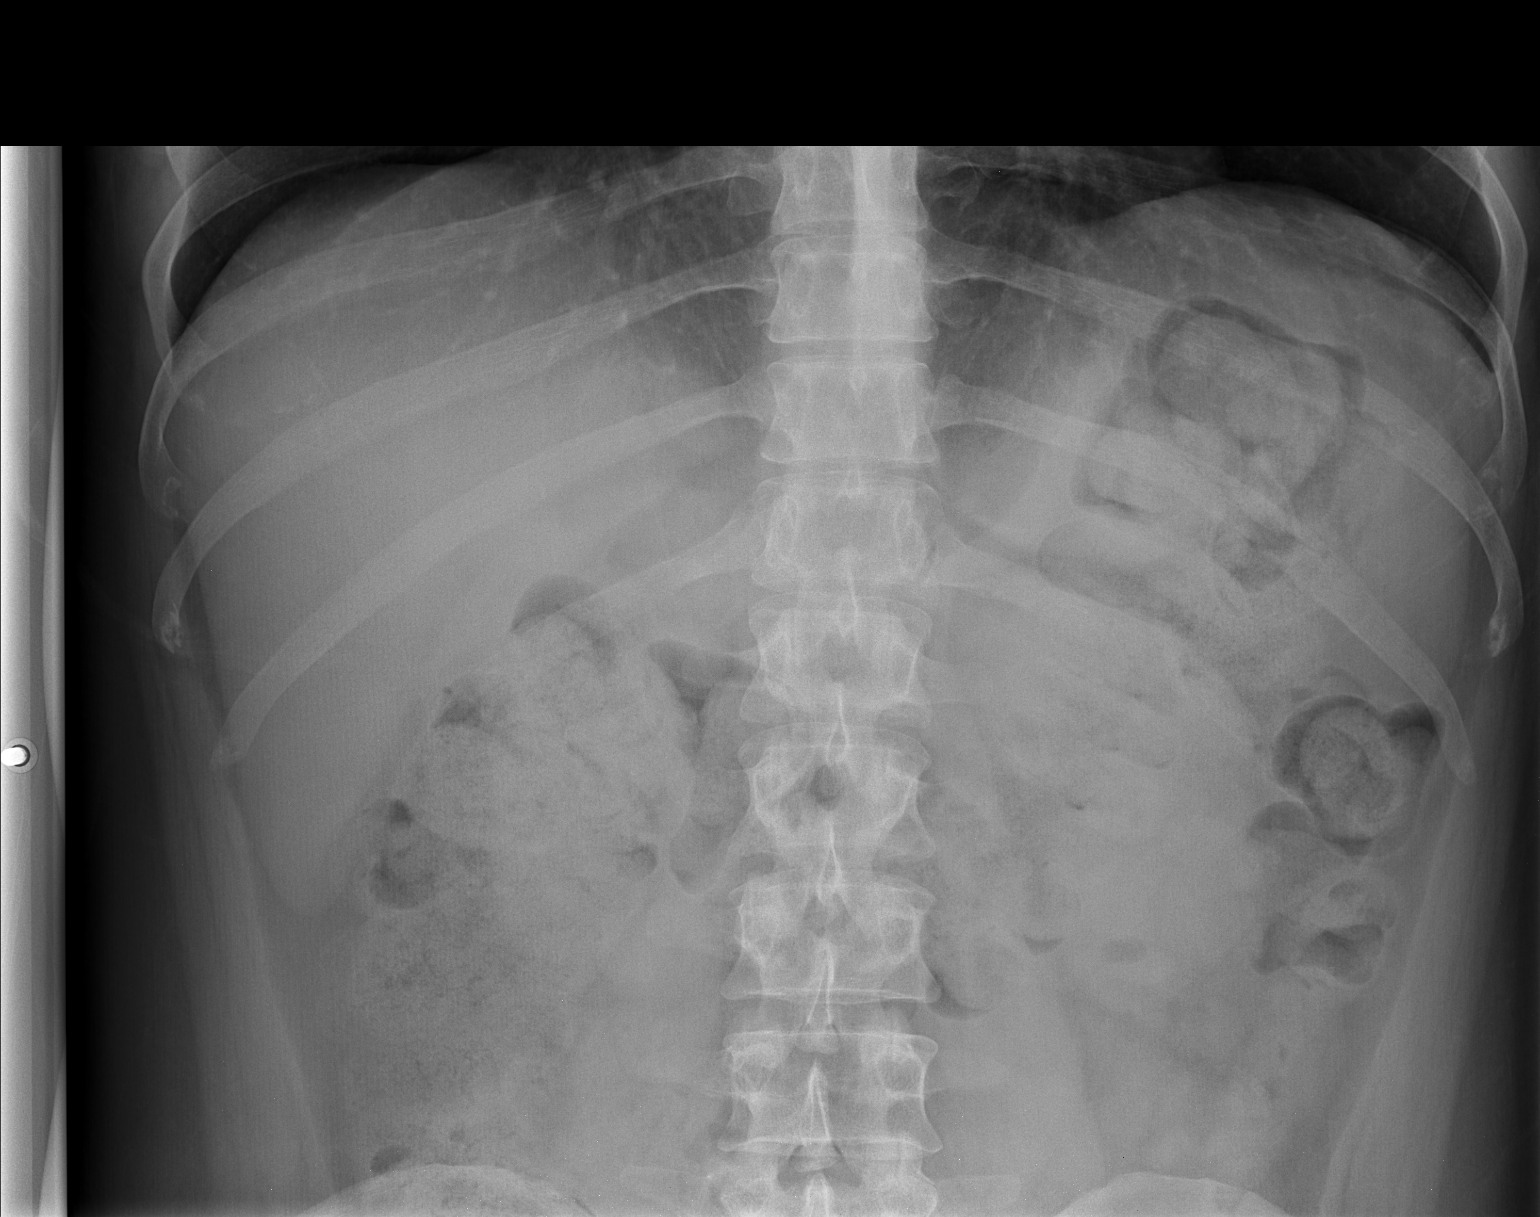

[2 of 2 positions shown; findings below may reference images not displayed]

FINDINGS: Constipation. No bowel obstruction. No radiopaque calculus or
foreign object. The osseous structures appear unremarkable.
IMPRESSION: Constipation.

No radiopaque calculus.

## 2017-10-09 ENCOUNTER — Other Ambulatory Visit: Payer: Self-pay

## 2017-10-09 ENCOUNTER — Encounter: Payer: Self-pay | Admitting: Emergency Medicine

## 2017-10-09 ENCOUNTER — Ambulatory Visit
Admission: EM | Admit: 2017-10-09 | Discharge: 2017-10-09 | Disposition: A | Discharge status: Home / Self Care | Payer: BLUE CROSS/BLUE SHIELD | Attending: Family Medicine | Admitting: Family Medicine

## 2017-10-09 DIAGNOSIS — J111 Influenza due to unidentified influenza virus with other respiratory manifestations: Secondary | ICD-10-CM

## 2017-10-09 MED ORDER — BENZONATATE 100 MG PO CAPS: 100.0000 mg | ORAL_CAPSULE | Freq: Three times a day (TID) | ORAL | 0 refills | Status: DC | PRN

## 2017-10-09 NOTE — Discharge Instructions (Signed)
Rest, fluids, tylenol & ibuprofen.  Cough medication as directed.  Take care  Dr. Adriana Simasook

## 2017-10-09 NOTE — ED Triage Notes (Signed)
Patient c/o cough, congestion and bodyaches that started on Thursday.

## 2017-10-09 NOTE — ED Provider Notes (Signed)
[facility]    CSN: [id number] Arrival date & time: 10/09/17  1156  History   Chief Complaint Chief Complaint  Patient presents with  . Cough  . Generalized Body Aches   HPI  32 year old male presents with cough, fever, body aches.  Started on Thursday.  Patient reports that he has had fever, T-max 101.2.  Associated chills.  He has had body aches, cough and dizziness as well.  He states that he continues to feel poorly.  No relief with over-the-counter cough and cold medication.  He came in today as he has missed work and continues to feel poorly.  He is concerned that he may have had the flu.  Pain is currently 3/10 in severity.  No other associated symptoms.  No other complaints.  Past Medical History:  Diagnosis Date  . Kidney stone    Surgical Hx - None.  Home Medications    Prior to Admission medications   Medication Sig Start Date End Date Taking? Authorizing Provider  amphetamine-dextroamphetamine (ADDERALL) 30 MG tablet Take 30 mg by mouth daily.   Yes [provider]  Buprenorphine HCl-Naloxone HCl (SUBOXONE SL) Place under the tongue.   Yes [provider]  benzonatate (TESSALON) 100 MG capsule Take 1 capsule (100 mg total) by mouth 3 (three) times daily as needed. 10/09/17   Tommie Samsook, Shy Guallpa G, DO    Family History No reported family hx.  Social History Social History   Tobacco Use  . Smoking status: Current Every Day Smoker  . Smokeless tobacco: Never Used  Substance Use Topics  . Alcohol use: No  . Drug use: Yes    Types: Marijuana    Allergies   Patient has no known allergies.   Review of Systems Review of Systems  Constitutional: Positive for chills and fever.  Respiratory: Positive for cough.   Musculoskeletal:       Body aches.  Neurological: Positive for dizziness.   Physical Exam Triage Vital Signs ED Triage Vitals  Enc Vitals Group     BP 10/09/17 1325 116/62     Pulse Rate 10/09/17 1325 69     Resp 10/09/17  1325 16     Temp 10/09/17 1325 98.1 F (36.7 C)     Temp Source 10/09/17 1325 Oral     SpO2 10/09/17 1325 100 %     Weight 10/09/17 1323 210 lb (95.3 kg)     Height 10/09/17 1323 6' (1.829 m)     Head Circumference --      Peak Flow --      Pain Score 10/09/17 1323 3     Pain Loc --      Pain Edu? --      Excl. in GC? --    Updated Vital Signs BP 116/62 (BP Location: Left Arm)   Pulse 69   Temp 98.1 F (36.7 C) (Oral)   Resp 16   Ht 6' (1.829 m)   Wt 210 lb (95.3 kg)   SpO2 100%   BMI 28.48 kg/m  Physical Exam  Constitutional: He is oriented to person, place, and time. He appears well-developed. No distress.  HENT:  Head: Normocephalic and atraumatic.  Mouth/Throat: Oropharynx is clear and moist.  Eyes: Conjunctivae are normal. Right eye exhibits no discharge. Left eye exhibits no discharge.  Cardiovascular: Normal rate and regular rhythm.  Pulmonary/Chest: Effort normal and breath sounds normal.  Neurological: He is alert and oriented to person, place, and time.  Psychiatric: He has  a normal mood and affect. His behavior is normal.  Nursing note and vitals reviewed.  UC Treatments / Results  Labs (all labs ordered are listed, but only abnormal results are displayed) Labs Reviewed - No data to display  EKG  EKG Interpretation None       Radiology No results found.  Procedures Procedures (including critical care time)  Medications Ordered in UC Medications - No data to display   Initial Impression / Assessment and Plan / UC Course  I have reviewed the triage vital signs and the nursing notes.  Pertinent labs & imaging results that were available during my care of the patient were reviewed by me and considered in my medical decision making (see chart for details).     32 year old male presents with influenza.  He is out of the treatment window.  Work note given.  Supportive care and Tessalon Perles for cough.  Final Clinical Impressions(s) / UC  Diagnoses   Final diagnoses:  Influenza    ED Discharge Orders        Ordered    benzonatate (TESSALON) 100 MG capsule  3 times daily PRN     10/09/17 1405     Controlled Substance Prescriptions [place] Controlled Substance Registry consulted? Not Applicable   Tommie Sams, DO 10/09/17 1430

## 2018-07-11 ENCOUNTER — Ambulatory Visit
Admission: EM | Admit: 2018-07-11 | Discharge: 2018-07-11 | Disposition: A | Discharge status: Home / Self Care | Payer: BLUE CROSS/BLUE SHIELD | Attending: Family Medicine | Admitting: Family Medicine

## 2018-07-11 ENCOUNTER — Other Ambulatory Visit: Payer: Self-pay

## 2018-07-11 ENCOUNTER — Encounter: Payer: Self-pay | Admitting: Emergency Medicine

## 2018-07-11 DIAGNOSIS — K529 Noninfective gastroenteritis and colitis, unspecified: Secondary | ICD-10-CM

## 2018-07-11 LAB — URINALYSIS, COMPLETE (UACMP) WITH MICROSCOPIC
Bacteria, UA: NONE SEEN
Bilirubin Urine: NEGATIVE
GLUCOSE, UA: NEGATIVE mg/dL
Hgb urine dipstick: NEGATIVE
KETONES UR: NEGATIVE mg/dL
Leukocytes, UA: NEGATIVE
Nitrite: NEGATIVE
Protein, ur: NEGATIVE mg/dL
RBC / HPF: NONE SEEN RBC/hpf (ref 0–5)
SQUAMOUS EPITHELIAL / LPF: NONE SEEN (ref 0–5)
Specific Gravity, Urine: 1.02 (ref 1.005–1.030)
pH: 7 (ref 5.0–8.0)

## 2018-07-11 LAB — COMPREHENSIVE METABOLIC PANEL
ALT: 31 U/L (ref 0–44)
AST: 26 U/L (ref 15–41)
Albumin: 4.2 g/dL (ref 3.5–5.0)
Alkaline Phosphatase: 49 U/L (ref 38–126)
Anion gap: 8 (ref 5–15)
BUN: 9 mg/dL (ref 6–20)
CO2: 24 mmol/L (ref 22–32)
Calcium: 8.9 mg/dL (ref 8.9–10.3)
Chloride: 106 mmol/L (ref 98–111)
Creatinine, Ser: 0.74 mg/dL (ref 0.61–1.24)
Glucose, Bld: 111 mg/dL — ABNORMAL HIGH (ref 70–99)
POTASSIUM: 3.9 mmol/L (ref 3.5–5.1)
SODIUM: 138 mmol/L (ref 135–145)
Total Bilirubin: 0.4 mg/dL (ref 0.3–1.2)
Total Protein: 7.8 g/dL (ref 6.5–8.1)

## 2018-07-11 LAB — LIPASE, BLOOD: LIPASE: 22 U/L (ref 11–51)

## 2018-07-11 MED ORDER — CIPROFLOXACIN HCL 500 MG PO TABS: 500.0000 mg | ORAL_TABLET | Freq: Two times a day (BID) | ORAL | 0 refills | Status: DC

## 2018-07-11 NOTE — ED Triage Notes (Signed)
Pt c/o abdominal pain in the center, pain when he has a BM and urinates first thing in the morning. He also has mucus in or around his stool. No blood that he can tell. He has also had nausea and intermittent diarrhea. Started about a month ago.

## 2018-07-11 NOTE — ED Provider Notes (Signed)
[facility]    CSN: [id number] Arrival date & time: 07/11/18  1019     History   Chief Complaint Chief Complaint  Patient presents with  . Nausea  . Abdominal Pain    HPI Michael BjorkDavid A Virts Jr. is a 32 y.o. male.   32 yo male with a c/o diffuse abdominal pains and mucousy diarrhea for the past month. Denies any blood, fevers, chills, vomiting, recent travel, known sick contacts.   The history is provided by the patient.  Abdominal Pain    Past Medical History:  Diagnosis Date  . Kidney stone     There are no active problems to display for this patient.   Past Surgical History:  Procedure Laterality Date  . NO PAST SURGERIES         Home Medications    Prior to Admission medications   Medication Sig Start Date End Date Taking? Authorizing Provider  amphetamine-dextroamphetamine (ADDERALL) 30 MG tablet Take 30 mg by mouth daily.   Yes [provider]  Buprenorphine HCl-Naloxone HCl (SUBOXONE SL) Place under the tongue.   Yes [provider]  LATUDA 40 MG TABS tablet TK 1 T PO IN THE EVE WF 06/11/18  Yes [provider]  benzonatate (TESSALON) 100 MG capsule Take 1 capsule (100 mg total) by mouth 3 (three) times daily as needed. 10/09/17   Tommie Samsook, Jayce G, DO  ciprofloxacin (CIPRO) 500 MG tablet Take 1 tablet (500 mg total) by mouth 2 (two) times daily. 07/11/18   Payton Mccallumonty, Maevis Mumby, MD    Family History Family History  Problem Relation Age of Onset  . Healthy Mother     Social History Social History   Tobacco Use  . Smoking status: Current Every Day Smoker    Packs/day: 1.00  . Smokeless tobacco: Never Used  Substance Use Topics  . Alcohol use: Yes    Comment: occasionally  . Drug use: Not Currently    Types: Marijuana     Allergies   Patient has no known allergies.   Review of Systems Review of Systems  Gastrointestinal: Positive for abdominal pain.     Physical Exam Triage Vital Signs ED Triage Vitals    Enc Vitals Group     BP 07/11/18 1103 109/75     Pulse Rate 07/11/18 1103 87     Resp 07/11/18 1103 18     Temp 07/11/18 1103 98.1 F (36.7 C)     Temp Source 07/11/18 1103 Oral     SpO2 07/11/18 1103 98 %     Weight 07/11/18 1059 200 lb (90.7 kg)     Height 07/11/18 1059 6' (1.829 m)     Head Circumference --      Peak Flow --      Pain Score 07/11/18 1059 2     Pain Loc --      Pain Edu? --      Excl. in GC? --    No data found.  Updated Vital Signs BP 109/75 (BP Location: Left Arm)   Pulse 87   Temp 98.1 F (36.7 C) (Oral)   Resp 18   Ht 6' (1.829 m)   Wt 90.7 kg   SpO2 98%   BMI 27.12 kg/m   Visual Acuity Right Eye Distance:   Left Eye Distance:   Bilateral Distance:    Right Eye Near:   Left Eye Near:    Bilateral Near:     Physical Exam  Constitutional: He is  oriented to person, place, and time. He appears well-developed and well-nourished. No distress.  HENT:  Head: Normocephalic and atraumatic.  Cardiovascular: Normal rate.  No murmur heard. Pulmonary/Chest: Effort normal. No respiratory distress.  Abdominal: Soft. Bowel sounds are normal. He exhibits no distension and no mass. There is tenderness (mild, diffuse). There is no rebound and no guarding.  Neurological: He is alert and oriented to person, place, and time.  Skin: No rash noted. He is not diaphoretic.  Nursing note and vitals reviewed.    UC Treatments / Results  Labs (all labs ordered are listed, but only abnormal results are displayed) Labs Reviewed  COMPREHENSIVE METABOLIC PANEL - Abnormal; Notable for the following components:      Result Value   Glucose, Bld 111 (*)    All other components within normal limits  URINALYSIS, COMPLETE (UACMP) WITH MICROSCOPIC  LIPASE, BLOOD    EKG None  Radiology No results found.  Procedures Procedures (including critical care time)  Medications Ordered in UC Medications - No data to display  Initial Impression / Assessment and Plan  / UC Course  I have reviewed the triage vital signs and the nursing notes.  Pertinent labs & imaging results that were available during my care of the patient were reviewed by me and considered in my medical decision making (see chart for details).      Final Clinical Impressions(s) / UC Diagnoses   Final diagnoses:  Gastroenteritis, acute    ED Prescriptions    Medication Sig Dispense Auth. Provider   ciprofloxacin (CIPRO) 500 MG tablet Take 1 tablet (500 mg total) by mouth 2 (two) times daily. 6 tablet Payton Mccallum, MD      1. Lab results and diagnosis reviewed with patient 2. rx as per orders above; reviewed possible side effects, interactions, risks and benefits  3. Recommend supportive treatment with otc imodium ad prn  4. Follow-up prn if symptoms worsen or don't improve   Controlled Substance Prescriptions [place] Controlled Substance Registry consulted? Not Applicable   Payton Mccallum, MD 07/11/18 1201

## 2019-12-26 ENCOUNTER — Ambulatory Visit
Admission: EM | Admit: 2019-12-26 | Discharge: 2019-12-26 | Disposition: A | Discharge status: Home / Self Care | Payer: BC Managed Care – PPO | Attending: Family Medicine | Admitting: Family Medicine

## 2019-12-26 ENCOUNTER — Other Ambulatory Visit: Payer: Self-pay

## 2019-12-26 DIAGNOSIS — J988 Other specified respiratory disorders: Secondary | ICD-10-CM

## 2019-12-26 MED ORDER — DOXYCYCLINE HYCLATE 100 MG PO CAPS: 100.0000 mg | ORAL_CAPSULE | Freq: Two times a day (BID) | ORAL | 0 refills | Status: DC

## 2019-12-26 NOTE — ED Triage Notes (Addendum)
Pt c/o body aches, sore throat, cough, congestion x 3 days. Had 2nd Pfizer vaccine on 5/11. Pt also pulled a tick off groin area 2 days ago. Mucinex helping.

## 2019-12-26 NOTE — Discharge Instructions (Signed)
Lots of fluids.  Medication as directed.  Take care  Dr. Tamber Burtch  

## 2019-12-26 NOTE — ED Provider Notes (Signed)
[facility]    CSN: [id number] Arrival date & time: 12/26/19  1015  History   Chief Complaint Chief Complaint  Patient presents with  . Generalized Body Aches   HPI  34 year old male presents with body aches, sore throat, congestion.  Patient reports that he has been sick for the past 3 days.  He recently had his second Covid vaccine.  He is unsure if this is contributing.  Patient is also recently pulled a tick off of his penis approximately 2 days ago.  He has been taking Mucinex for his symptoms and states that it seems to be improving some.  He states that he has mild redness around the site of the tick bite.  No documented fever.  No other associated symptoms.  No other complaints.  Past Medical History:  Diagnosis Date  . Kidney stone    Past Surgical History:  Procedure Laterality Date  . NO PAST SURGERIES     Home Medications    Prior to Admission medications   Medication Sig Start Date End Date Taking? Authorizing Provider  amphetamine-dextroamphetamine (ADDERALL) 30 MG tablet Take 30 mg by mouth daily.    [provider]  Buprenorphine HCl-Naloxone HCl (SUBOXONE SL) Place under the tongue.    [provider]  doxycycline (VIBRAMYCIN) 100 MG capsule Take 1 capsule (100 mg total) by mouth 2 (two) times daily. 12/26/19   Coral Spikes, DO  LATUDA 40 MG TABS tablet 60 mg.  06/11/18   [provider]    Family History Family History  Problem Relation Age of Onset  . Healthy Mother     Social History Social History   Tobacco Use  . Smoking status: Current Every Day Smoker    Packs/day: 1.00  . Smokeless tobacco: Never Used  Substance Use Topics  . Alcohol use: Yes    Comment: occasionally  . Drug use: Not Currently    Types: Marijuana     Allergies   Patient has no known allergies.   Review of Systems Review of Systems  HENT: Positive for congestion and sore throat.   Musculoskeletal:       Body aches.    Physical Exam Triage Vital Signs ED Triage Vitals  Enc Vitals Group     BP 12/26/19 1028 132/78     Pulse Rate 12/26/19 1028 80     Resp 12/26/19 1028 16     Temp 12/26/19 1028 98.7 F (37.1 C)     Temp src --      SpO2 12/26/19 1028 100 %     Weight --      Height --      Head Circumference --      Peak Flow --      Pain Score 12/26/19 1029 0     Pain Loc --      Pain Edu? --      Excl. in [place]? --    Updated Vital Signs BP 132/78   Pulse 80   Temp 98.7 F (37.1 C)   Resp 16   SpO2 100%   Visual Acuity Right Eye Distance:   Left Eye Distance:   Bilateral Distance:    Right Eye Near:   Left Eye Near:    Bilateral Near:     Physical Exam Vitals and nursing note reviewed.  Constitutional:      General: He is not in acute distress.    Appearance: Normal appearance. He is not ill-appearing.  HENT:     Head: Normocephalic and atraumatic.     Mouth/Throat:     Pharynx: Oropharynx is clear.  Eyes:     General:        Right eye: No discharge.        Left eye: No discharge.     Conjunctiva/sclera: Conjunctivae normal.  Cardiovascular:     Rate and Rhythm: Normal rate and regular rhythm.  Pulmonary:     Effort: Pulmonary effort is normal.     Breath sounds: Normal breath sounds. No wheezing, rhonchi or rales.  Genitourinary:    Comments: Small eschar noted below the head of the penis.  No evidence of erythema or rash. Neurological:     Mental Status: He is alert.  Psychiatric:        Mood and Affect: Mood normal.        Behavior: Behavior normal.    UC Treatments / Results  Labs (all labs ordered are listed, but only abnormal results are displayed) Labs Reviewed - No data to display  EKG   Radiology No results found.  Procedures Procedures (including critical care time)  Medications Ordered in UC Medications - No data to display  Initial Impression / Assessment and Plan / UC Course  I have reviewed the triage vital signs and the nursing notes.   Pertinent labs & imaging results that were available during my care of the patient were reviewed by me and considered in my medical decision making (see chart for details).    34 year old male presents with a respiratory infection recent tick bite.  Covering with doxycycline.  Final Clinical Impressions(s) / UC Diagnoses   Final diagnoses:  Respiratory infection     Discharge Instructions     Lots of fluids.  Medication as directed.  Take care  Dr. Adriana Simas    ED Prescriptions    Medication Sig Dispense Auth. Provider   doxycycline (VIBRAMYCIN) 100 MG capsule Take 1 capsule (100 mg total) by mouth 2 (two) times daily. 14 capsule Everlene Other G, DO     PDMP not reviewed this encounter.   Tommie Sams, [place] 12/26/19 1217

## 2022-06-07 ENCOUNTER — Ambulatory Visit
Admission: EM | Admit: 2022-06-07 | Discharge: 2022-06-07 | Disposition: A | Discharge status: Home / Self Care | Payer: BC Managed Care – PPO | Attending: Emergency Medicine | Admitting: Emergency Medicine

## 2022-06-07 DIAGNOSIS — M5441 Lumbago with sciatica, right side: Secondary | ICD-10-CM | POA: Diagnosis not present

## 2022-06-07 DIAGNOSIS — S39012A Strain of muscle, fascia and tendon of lower back, initial encounter: Secondary | ICD-10-CM

## 2022-06-07 MED ORDER — METHYLPREDNISOLONE 4 MG PO TBPK: ORAL_TABLET | Freq: Every day | ORAL | 0 refills | Status: DC

## 2022-06-07 MED ORDER — ACETAMINOPHEN 500 MG PO TABS
1000.0000 mg | ORAL_TABLET | Freq: Once | ORAL | Status: AC
  Administered 2022-06-07: 1000 mg via ORAL

## 2022-06-07 MED ORDER — KETOROLAC TROMETHAMINE 60 MG/2ML IM SOLN
30.0000 mg | Freq: Once | INTRAMUSCULAR | Status: AC
  Administered 2022-06-07: 30 mg via INTRAMUSCULAR

## 2022-06-07 MED ORDER — TIZANIDINE HCL 4 MG PO TABS: 4.0000 mg | ORAL_TABLET | Freq: Three times a day (TID) | ORAL | 0 refills | Status: AC | PRN

## 2022-06-07 MED ORDER — NAPROXEN 500 MG PO TABS: 500.0000 mg | ORAL_TABLET | Freq: Two times a day (BID) | ORAL | 0 refills | Status: DC

## 2022-06-07 NOTE — ED Triage Notes (Addendum)
Pt c/o lower back pain onset today, pt states he bent down and felt a pop.

## 2022-06-07 NOTE — ED Provider Notes (Signed)
HPI  SUBJECTIVE:  Michael Moses. is a 36 y.o. male who presents with V acute onset of midline low back pain starting earlier today.  States that he was bending forward, felt a pop, and fell down.  States that he could not get off of the floor secondary to pain for about 30 minutes.  He describes the pain as dull and constant, stabbing with certain movements.  No urinary or fecal incontinence, urinary retention, bilateral radicular pain, saddle anesthesia, distal weakness/numbness, fevers, urinary complaints, direct trauma to the area, fevers, nausea, vomiting, syncope, abdominal pain.  He has had identical symptoms before with low back strains in this area multiple times.  He has tried Tylenol, massage.  Massage and sitting still help.  Symptoms are worse with trying to move and walk.  He has a past medical history of nonobstructing nephrolithiasis x1.  No history of UTI, pyelonephritis, osteoporosis, prolonged steroid use, HIV, IVDU, cancer, aortic abdominal aneurysm.  He is currently on Suboxone and Latuda for insomnia.  PCP: None.  Past Medical History:  Diagnosis Date   Kidney stone     Past Surgical History:  Procedure Laterality Date   NO PAST SURGERIES      Family History  Problem Relation Age of Onset   Healthy Mother     Social History   Tobacco Use   Smoking status: Every Day    Packs/day: 1.00    Types: Cigarettes   Smokeless tobacco: Never  Vaping Use   Vaping Use: Some days  Substance Use Topics   Alcohol use: Yes    Comment: occasionally   Drug use: Not Currently    Types: Marijuana    No current facility-administered medications for this encounter.  Current Outpatient Medications:    amphetamine-dextroamphetamine (ADDERALL) 30 MG tablet, Take 30 mg by mouth daily., Disp: , Rfl:    Buprenorphine HCl-Naloxone HCl (SUBOXONE SL), Place under the tongue., Disp: , Rfl:    LATUDA 40 MG TABS tablet, 60 mg. , Disp: , Rfl: 0   methylPREDNISolone (MEDROL DOSEPAK)  4 MG TBPK tablet, Take by mouth daily. Follow package instructions, Disp: 21 tablet, Rfl: 0   naproxen (NAPROSYN) 500 MG tablet, Take 1 tablet (500 mg total) by mouth 2 (two) times daily., Disp: 20 tablet, Rfl: 0   tiZANidine (ZANAFLEX) 4 MG tablet, Take 1 tablet (4 mg total) by mouth every 8 (eight) hours as needed for muscle spasms., Disp: 30 tablet, Rfl: 0  No Known Allergies   ROS  As noted in HPI.   Physical Exam  BP 117/70 (BP Location: Right Arm)   Pulse 85   Temp 98.2 F (36.8 C) (Oral)   Ht 6' (1.829 m)   Wt 95.3 kg   SpO2 97%   BMI 28.48 kg/m   Constitutional: Well developed, well nourished, no acute distress Eyes:  EOMI, conjunctiva normal bilaterally HENT: Normocephalic, atraumatic,mucus membranes moist Respiratory: Normal inspiratory effort Cardiovascular: Normal rate GI: nondistended. No suprapubic, flank tenderness skin: No rash, skin intact Musculoskeletal: no CVAT.  Left paralumbar tenderness, positive left paralumbar muscle spasm.  Tenderness L4, L5, S1.  No  SI joint, sacral bony tenderness. Bilateral lower extremities nontender, baseline ROM with intact  PT pulses.  Sensation between thighs intact.  Pain with flexion hips bilaterally against resistance.  No pain with passive int/ext rotation,  AD/ABduction bilaterally. SLR positive right side. Sensation baseline light touch bilaterally for Pt, DTR's symmetric and intact bilaterally KJ, Motor symmetric bilateral 5/5 hip flexion, quadriceps,  hamstrings, EHL, foot dorsiflexion, foot plantarflexion, gait  antalgic but without apparent new ataxia. Neurologic: Alert & oriented x 3, no focal neuro deficits Psychiatric: Speech and behavior appropriate   ED Course   Medications  acetaminophen (TYLENOL) tablet 1,000 mg (1,000 mg Oral Given 06/07/22 1920)  ketorolac (TORADOL) injection 30 mg (30 mg Intramuscular Given 06/07/22 1921)    Orders Placed This Encounter  Procedures   Nursing Communication Please set up  with a PCP prior to discharge    Please set up with a PCP prior to discharge    Standing Status:   Standing    Number of Occurrences:   1    No results found for this or any previous visit (from the past 24 hour(s)). No results found.  ED Clinical Impression  1. Strain of lumbar region, initial encounter   2. Acute midline low back pain with right-sided sciatica     ED Assessment/Plan      Pt describing typical back pain, has been < 6 week duration. No historical red flags as noted in HPI. We discussed getting x-rays today due to the midline bony tenderness, but in the absence of direct trauma, I think that they would be relatively low yield.  Doubt fracture.  No evidence of cauda equina syndrome.   He states that he has had identical symptoms before.  Giving 30 mg of Toradol with 1000 mg of Tylenol p.o. here.  Will try treating with NSAIDs, Tylenol, Medrol Dosepak, Zanaflex, heat, TENS unit, lidocaine patches.  He will return here or follow-up with sports medicine if not better in 4 to 5 days, we can image him at that time.  will place urgent referral to Dr. Zigmund Daniel.  ER return precautions given.  Discussed MDM, treatment plan, and plan for follow-up with patient. Discussed sn/sx that should prompt return to the ED. patient agrees with plan.   Meds ordered this encounter  Medications   acetaminophen (TYLENOL) tablet 1,000 mg   ketorolac (TORADOL) injection 30 mg   methylPREDNISolone (MEDROL DOSEPAK) 4 MG TBPK tablet    Sig: Take by mouth daily. Follow package instructions    Dispense:  21 tablet    Refill:  0   tiZANidine (ZANAFLEX) 4 MG tablet    Sig: Take 1 tablet (4 mg total) by mouth every 8 (eight) hours as needed for muscle spasms.    Dispense:  30 tablet    Refill:  0   naproxen (NAPROSYN) 500 MG tablet    Sig: Take 1 tablet (500 mg total) by mouth 2 (two) times daily.    Dispense:  20 tablet    Refill:  0    *This clinic note was created using Theatre manager. Therefore, there may be occasional mistakes despite careful proofreading.  ?     Melynda Ripple, MD 06/08/22 [id number]

## 2022-06-07 NOTE — Discharge Instructions (Addendum)
Take the Naprosyn with 1000 mg of Tylenol twice a day.  May take an 1000 mg of Tylenol 1 more times a day.  Finish the Medrol Dosepak, even if you feel better.  The Zanaflex will help with muscle spasms.  Heat, TENS unit, OTC lidocaine patches can also help.  I have put in an urgent referral to Dr. Zigmund Daniel, sports medicine.  Please follow-up with him ASAP.  Go to the ER for the signs and symptoms we discussed

## 2022-11-19 ENCOUNTER — Ambulatory Visit
Admission: EM | Admit: 2022-11-19 | Discharge: 2022-11-19 | Disposition: A | Discharge status: Home / Self Care | Payer: BC Managed Care – PPO | Attending: Physician Assistant | Admitting: Physician Assistant

## 2022-11-19 DIAGNOSIS — K1120 Sialoadenitis, unspecified: Secondary | ICD-10-CM | POA: Insufficient documentation

## 2022-11-19 DIAGNOSIS — R591 Generalized enlarged lymph nodes: Secondary | ICD-10-CM | POA: Insufficient documentation

## 2022-11-19 LAB — GROUP A STREP BY PCR: Group A Strep by PCR: NOT DETECTED

## 2022-11-19 MED ORDER — AMOXICILLIN-POT CLAVULANATE 875-125 MG PO TABS: 1.0000 | ORAL_TABLET | Freq: Two times a day (BID) | ORAL | 0 refills | Status: AC

## 2022-11-19 NOTE — Discharge Instructions (Signed)
-  Possible salivary infection vs obstruction. -I sent antibiotics to pharmacy -Suck on hard and sour candies to produce saliva -Ibuprofen and/or Tylenol for pain -If fever, increased swelling, inability to produce saliva or swallow go to ER.

## 2022-11-19 NOTE — ED Provider Notes (Signed)
[facility]    CSN: [id number] Arrival date & time: 11/19/22  1234      History   Chief Complaint Chief Complaint  Patient presents with   Sore Throat    X1 week sore throat    HPI Michael Moses. is a 37 y.o. male presenting for swelling of the left side of his neck as well as pain under his tongue for the past week.  Reports known cavities of the left lower side.  Saw dentistry last week for this.  He says that he had a dental cleaning performed and the same day he started to experience the symptoms.  He has a follow-up with his dentist in a couple of days for evaluation and to have the cavities filled.  He reports that when he eats he has increased swelling in the side of his neck.  Reports that the swelling goes down when he is not eating.  Denies fever, sore throat, painful swallowing, cough, congestion, difficulty swallowing.  Patient says his teeth are not hurting at this time.  Also denies sinus pain or ear pain.  Denies reduced salivary output. Has tried OTC meds.  No other complaints.  HPI  Past Medical History:  Diagnosis Date   Kidney stone     There are no problems to display for this patient.   Past Surgical History:  Procedure Laterality Date   NO PAST SURGERIES         Home Medications    Prior to Admission medications   Medication Sig Start Date End Date Taking? Authorizing Provider  amoxicillin-clavulanate (AUGMENTIN) 875-125 MG tablet Take 1 tablet by mouth every 12 (twelve) hours for 7 days. 11/19/22 11/26/22 Yes Shirlee Latch, PA-C  amphetamine-dextroamphetamine (ADDERALL) 30 MG tablet Take 30 mg by mouth daily.   Yes [provider]  Buprenorphine HCl-Naloxone HCl (SUBOXONE SL) Place under the tongue.   Yes [provider]  LATUDA 40 MG TABS tablet 60 mg.  06/11/18  Yes [provider]  methylPREDNISolone (MEDROL DOSEPAK) 4 MG TBPK tablet Take by mouth daily. Follow package instructions 06/07/22    Domenick Gong, MD  naproxen (NAPROSYN) 500 MG tablet Take 1 tablet (500 mg total) by mouth 2 (two) times daily. 06/07/22   Domenick Gong, MD  tiZANidine (ZANAFLEX) 4 MG tablet Take 1 tablet (4 mg total) by mouth every 8 (eight) hours as needed for muscle spasms. 06/07/22   Domenick Gong, MD    Family History Family History  Problem Relation Age of Onset   Healthy Mother     Social History Social History   Tobacco Use   Smoking status: Every Day    Packs/day: 1    Types: Cigarettes   Smokeless tobacco: Never  Vaping Use   Vaping Use: Some days  Substance Use Topics   Alcohol use: Yes    Comment: occasionally   Drug use: Not Currently    Types: Marijuana     Allergies   Patient has no known allergies.   Review of Systems Review of Systems  Constitutional:  Negative for fatigue and fever.  HENT:  Negative for congestion, ear pain, mouth sores, rhinorrhea, sinus pressure, sinus pain, sore throat and trouble swallowing.        +mouth pain and pain under tongue  Respiratory:  Negative for cough and shortness of breath.   Gastrointestinal:  Negative for nausea and vomiting.  Neurological:  Negative for weakness and headaches.  Hematological:  Positive for adenopathy.  Physical Exam Triage Vital Signs ED Triage Vitals  Enc Vitals Group     BP 11/19/22 1240 123/79     Pulse Rate 11/19/22 1240 74     Resp 11/19/22 1240 20     Temp 11/19/22 1240 97.7 F (36.5 C)     Temp Source 11/19/22 1240 Oral     SpO2 11/19/22 1240 100 %     Weight 11/19/22 1239 205 lb (93 kg)     Height 11/19/22 1239 6' (1.829 m)     Head Circumference --      Peak Flow --      Pain Score 11/19/22 1239 1     Pain Loc --      Pain Edu? --      Excl. in GC? --    No data found.  Updated Vital Signs BP 123/79 (BP Location: Left Arm)   Pulse 74   Temp 97.7 F (36.5 C) (Oral)   Resp 20   Ht 6' (1.829 m)   Wt 205 lb (93 kg)   SpO2 100%   BMI 27.80 kg/m    Physical  Exam Vitals and nursing note reviewed.  Constitutional:      General: He is not in acute distress.    Appearance: Normal appearance. He is well-developed. He is not ill-appearing.  HENT:     Head: Normocephalic and atraumatic.     Nose: Nose normal.     Mouth/Throat:     Mouth: Mucous membranes are moist.     Pharynx: Oropharynx is clear.     Comments: No swelling/erythema around teeth. No tenderness of teeth. Mild swelling of sublingual salivary glands with mild TTP. Eyes:     General: No scleral icterus.    Conjunctiva/sclera: Conjunctivae normal.  Cardiovascular:     Rate and Rhythm: Normal rate and regular rhythm.     Heart sounds: Normal heart sounds.  Pulmonary:     Effort: Pulmonary effort is normal. No respiratory distress.     Breath sounds: Normal breath sounds.  Abdominal:     Palpations: Abdomen is soft.  Musculoskeletal:     Cervical back: Neck supple.  Lymphadenopathy:     Cervical: Cervical adenopathy present.  Skin:    General: Skin is warm and dry.     Capillary Refill: Capillary refill takes less than 2 seconds.  Neurological:     General: No focal deficit present.     Mental Status: He is alert. Mental status is at baseline.     Motor: No weakness.     Gait: Gait normal.  Psychiatric:        Mood and Affect: Mood normal.        Behavior: Behavior normal.      UC Treatments / Results  Labs (all labs ordered are listed, but only abnormal results are displayed) Labs Reviewed  GROUP A STREP BY PCR    EKG   Radiology No results found.  Procedures Procedures (including critical care time)  Medications Ordered in UC Medications - No data to display  Initial Impression / Assessment and Plan / UC Course  I have reviewed the triage vital signs and the nursing notes.  Pertinent labs & imaging results that were available during my care of the patient were reviewed by me and considered in my medical decision making (see chart for details).    37 year old male presents for pain and swelling under tongue into the left side of his neck for the past week.  Symptoms started after he had pain in his teeth on the left side and dental cleaning.  He is supposed to have teeth filled in a couple days.  On exam he does have swelling of the sublingual salivary glands mildly also swelling to the left anterior cervical lymph nodes.  Strep test performed was negative.  Suspect sialoadenitis likely related to a small stone/obstruction versus infection.  Since he had the recent dental issues, will cover the potential infection with Augmentin.  Advised him to keep follow-up with the dentist and have them look at the area in a few days.  Discussed using hard and sour candies to increase the fluid for saliva to see if that will help.  I Profen Tylenol for pain relief.  Plenty rest and fluids.  Reviewed return to ER precautions.   Final Clinical Impressions(s) / UC Diagnoses   Final diagnoses:  Sialadenitis  Lymphadenopathy of head and neck     Discharge Instructions      -Possible salivary infection vs obstruction. -I sent antibiotics to pharmacy -Suck on hard and sour candies to produce saliva -Ibuprofen and/or Tylenol for pain -If fever, increased swelling, inability to produce saliva or swallow go to ER.     ED Prescriptions     Medication Sig Dispense Auth. Provider   amoxicillin-clavulanate (AUGMENTIN) 875-125 MG tablet Take 1 tablet by mouth every 12 (twelve) hours for 7 days. 14 tablet Gareth Morgan      PDMP not reviewed this encounter.   Shirlee Latch, PA-C 11/19/22 1359

## 2022-11-19 NOTE — ED Triage Notes (Signed)
Pt states that the left side of his throat hurts. X1 week

## 2023-08-21 ENCOUNTER — Ambulatory Visit
Admission: EM | Admit: 2023-08-21 | Discharge: 2023-08-21 | Disposition: A | Discharge status: Home / Self Care | Payer: BC Managed Care – PPO | Attending: Physician Assistant | Admitting: Physician Assistant

## 2023-08-21 ENCOUNTER — Encounter: Payer: Self-pay | Admitting: Emergency Medicine

## 2023-08-21 DIAGNOSIS — J069 Acute upper respiratory infection, unspecified: Secondary | ICD-10-CM | POA: Diagnosis present

## 2023-08-21 DIAGNOSIS — R0981 Nasal congestion: Secondary | ICD-10-CM

## 2023-08-21 DIAGNOSIS — J029 Acute pharyngitis, unspecified: Secondary | ICD-10-CM

## 2023-08-21 DIAGNOSIS — H9203 Otalgia, bilateral: Secondary | ICD-10-CM

## 2023-08-21 LAB — GROUP A STREP BY PCR: Group A Strep by PCR: NOT DETECTED

## 2023-08-21 MED ORDER — PSEUDOEPHEDRINE HCL ER 120 MG PO TB12: 120.0000 mg | ORAL_TABLET | Freq: Two times a day (BID) | ORAL | 0 refills | Status: AC

## 2023-08-21 MED ORDER — IPRATROPIUM BROMIDE 0.06 % NA SOLN: 2.0000 | Freq: Four times a day (QID) | NASAL | 0 refills | Status: AC

## 2023-08-21 NOTE — Discharge Instructions (Signed)

## 2023-08-21 NOTE — ED Triage Notes (Signed)
 Patient c/o bilateral ear discomfort, sore throat and fatigue x 2 days.

## 2023-08-21 NOTE — ED Provider Notes (Signed)
 [facility]    CSN: [id number] Arrival date & time: 08/21/23  0857      History   Chief Complaint Chief Complaint  Patient presents with   Otalgia    HPI Michael Moses. is a 38 y.o. male presenting for bilateral ear pain/pressure, nasal congestion, sore throat and fatigue x 2 days.  Denies fever, cough, wheezing, shortness of breath, vomiting or diarrhea.  Reports that his daughter was diagnosed with an ear infection and strep throat last week.  He has not had any medicine for symptoms.  HPI  Past Medical History:  Diagnosis Date   Kidney stone     There are no active problems to display for this patient.   Past Surgical History:  Procedure Laterality Date   NO PAST SURGERIES         Home Medications    Prior to Admission medications   Medication Sig Start Date End Date Taking? Authorizing Provider  amphetamine-dextroamphetamine (ADDERALL) 30 MG tablet Take 30 mg by mouth daily.   Yes [provider]  Buprenorphine HCl-Naloxone HCl (SUBOXONE SL) Place under the tongue.   Yes [provider]  ipratropium (ATROVENT ) 0.06 % nasal spray Place 2 sprays into both nostrils 4 (four) times daily. 08/21/23  Yes Arvis Jolan NOVAK, PA-C  pseudoephedrine  (SUDAFED) 120 MG 12 hr tablet Take 1 tablet (120 mg total) by mouth 2 (two) times daily for 7 days. 08/21/23 08/28/23 Yes Arvis Jolan B, PA-C  LATUDA 40 MG TABS tablet 60 mg.  06/11/18   [provider]  tiZANidine  (ZANAFLEX ) 4 MG tablet Take 1 tablet (4 mg total) by mouth every 8 (eight) hours as needed for muscle spasms. 06/07/22   Van Knee, MD    Family History Family History  Problem Relation Age of Onset   Healthy Mother     Social History Social History   Tobacco Use   Smoking status: Every Day    Current packs/day: 1.00    Types: Cigarettes   Smokeless tobacco: Never  Vaping Use   Vaping status: Some Days  Substance Use Topics   Alcohol use: Yes     Comment: occasionally   Drug use: Not Currently    Types: Marijuana     Allergies   Patient has no known allergies.   Review of Systems Review of Systems  Constitutional:  Positive for fatigue. Negative for fever.  HENT:  Positive for congestion, ear pain, rhinorrhea and sore throat. Negative for ear discharge, facial swelling, sinus pressure and sinus pain.   Respiratory:  Negative for cough.   Neurological:  Negative for weakness, light-headedness and headaches.  Hematological:  Negative for adenopathy.     Physical Exam Triage Vital Signs ED Triage Vitals  Encounter Vitals Group     BP      Systolic BP Percentile      Diastolic BP Percentile      Pulse      Resp      Temp      Temp src      SpO2      Weight      Height      Head Circumference      Peak Flow      Pain Score      Pain Loc      Pain Education      Exclude from Growth Chart    No data found.  Updated Vital Signs BP 111/81 (BP Location: Left Arm)  Pulse 73   Temp 98.2 F (36.8 C) (Oral)   Resp 18   SpO2 99%   Physical Exam Vitals and nursing note reviewed.  Constitutional:      General: He is not in acute distress.    Appearance: Normal appearance. He is well-developed. He is not ill-appearing.  HENT:     Head: Normocephalic and atraumatic.     Right Ear: Ear canal and external ear normal. A middle ear effusion is present.     Left Ear: Tympanic membrane, ear canal and external ear normal.     Ears:     Comments: Moderate cerumen left EAC    Nose: Congestion present.     Mouth/Throat:     Mouth: Mucous membranes are moist.     Pharynx: Oropharynx is clear. Posterior oropharyngeal erythema present.  Eyes:     General: No scleral icterus.    Conjunctiva/sclera: Conjunctivae normal.  Cardiovascular:     Rate and Rhythm: Normal rate and regular rhythm.     Heart sounds: Normal heart sounds.  Pulmonary:     Effort: Pulmonary effort is normal. No respiratory distress.     Breath  sounds: Normal breath sounds.  Musculoskeletal:     Cervical back: Neck supple.  Skin:    General: Skin is warm and dry.     Capillary Refill: Capillary refill takes less than 2 seconds.  Neurological:     General: No focal deficit present.     Mental Status: He is alert. Mental status is at baseline.     Motor: No weakness.     Gait: Gait normal.  Psychiatric:        Mood and Affect: Mood normal.        Behavior: Behavior normal.      UC Treatments / Results  Labs (all labs ordered are listed, but only abnormal results are displayed) Labs Reviewed  GROUP A STREP BY PCR    EKG   Radiology No results found.  Procedures Procedures (including critical care time)  Medications Ordered in UC Medications - No data to display  Initial Impression / Assessment and Plan / UC Course  I have reviewed the triage vital signs and the nursing notes.  Pertinent labs & imaging results that were available during my care of the patient were reviewed by me and considered in my medical decision making (see chart for details).   38 year old male presents for bilateral ear pain/pressure, nasal congestion and sore throat x 2 days.  Exposed to strep last week by his daughter.  Vitals are stable and normal and he is overall well-appearing.  On exam he has nasal congestion, mild posterior pharyngeal erythema with clear postnasal drainage, hazy effusion of right TM, moderate cerumen in left EAC.  Chest clear.  Strep testing obtained..  Viral URI.  Supportive care encouraged increased rest and fluids.  Sent Atrovent  nasal spray and Sudafed to pharmacy.  Reviewed return precautions.  Work note given.   Final Clinical Impressions(s) / UC Diagnoses   Final diagnoses:  Viral upper respiratory tract infection  Acute otalgia, bilateral  Sore throat  Nasal congestion     Discharge Instructions      URI/COLD SYMPTOMS: Your exam today is consistent with a viral illness. Antibiotics are not  indicated at this time. Use medications as directed, including cough syrup, nasal saline, and decongestants. Your symptoms should improve over the next few days and resolve within 7-10 days. Increase rest and fluids. F/u if symptoms worsen  or predominate such as sore throat, ear pain, productive cough, shortness of breath, or if you develop high fevers or worsening fatigue over the next several days.       ED Prescriptions     Medication Sig Dispense Auth. Provider   ipratropium (ATROVENT ) 0.06 % nasal spray Place 2 sprays into both nostrils 4 (four) times daily. 15 mL Arvis Huxley B, PA-C   pseudoephedrine  (SUDAFED) 120 MG 12 hr tablet Take 1 tablet (120 mg total) by mouth 2 (two) times daily for 7 days. 14 tablet Yana Schorr B, PA-C      PDMP not reviewed this encounter.   Arvis Huxley NOVAK, PA-C 08/21/23 1037
